# Patient Record
Sex: Female | Born: 1970 | Race: Asian | Hispanic: No | Marital: Married | Smoking: Never smoker
Health system: Southern US, Community
[De-identification: ages and names within clinical notes are randomized; demographics above are authoritative.]

## PROBLEM LIST (undated history)

## (undated) DIAGNOSIS — R7303 Prediabetes: Secondary | ICD-10-CM

## (undated) DIAGNOSIS — R51 Headache: Secondary | ICD-10-CM

## (undated) DIAGNOSIS — R519 Headache, unspecified: Secondary | ICD-10-CM

## (undated) DIAGNOSIS — Z9109 Other allergy status, other than to drugs and biological substances: Secondary | ICD-10-CM

## (undated) HISTORY — PX: ESOPHAGOGASTRODUODENOSCOPY: SHX1529

---

## 2012-03-13 ENCOUNTER — Ambulatory Visit: Payer: Self-pay | Admitting: Internal Medicine

## 2013-04-18 ENCOUNTER — Ambulatory Visit: Payer: Self-pay | Admitting: Internal Medicine

## 2013-11-07 IMAGING — US US EXTREM LOW VENOUS*R*
1 series · 14 of 24 positions shown · non-contrast
Comparison: none

REASON FOR EXAM: Right calf pain and swelling
COMMENTS:

PROCEDURE:     US  - US DOPPLER LOW EXTR RIGHT  - April 18, 2013  [DATE]
RESULT:     Standard right Lower Extremity color flow dopplerobtained. No
deep venous thrombosis. Color flow analysis and Doppler analysis as well as
compression studies are normal.

[Series 1: us extrem low venous*right* · 0.10mm/px · 14 of 25 slices shown]
[im 1/25]
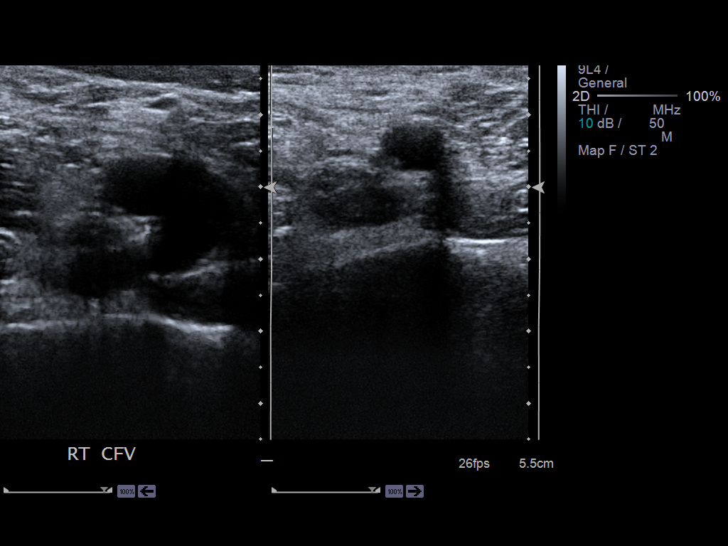
[im 3/25]
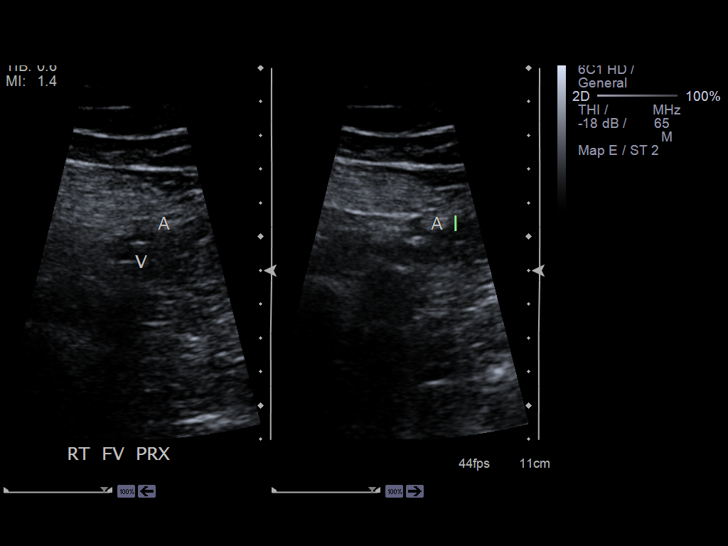
[im 5/25]
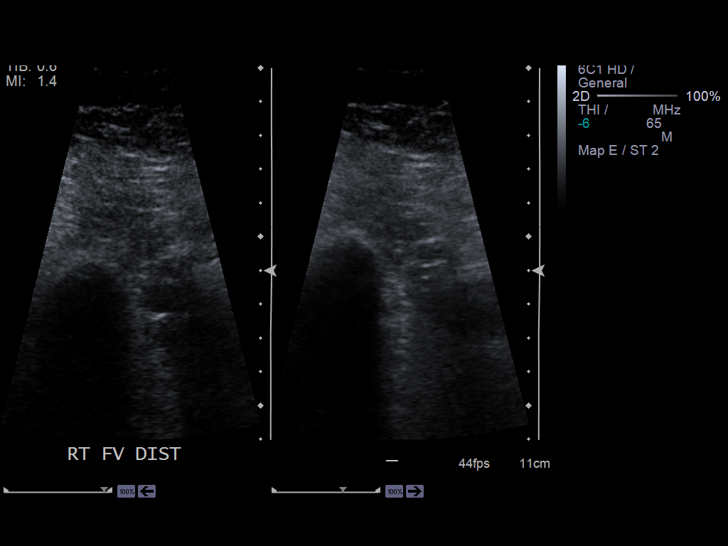
[im 7/25]
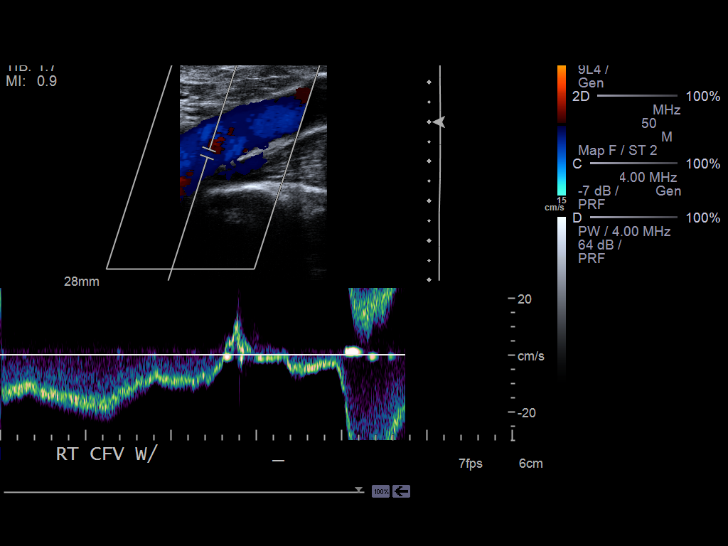
[im 8/25]
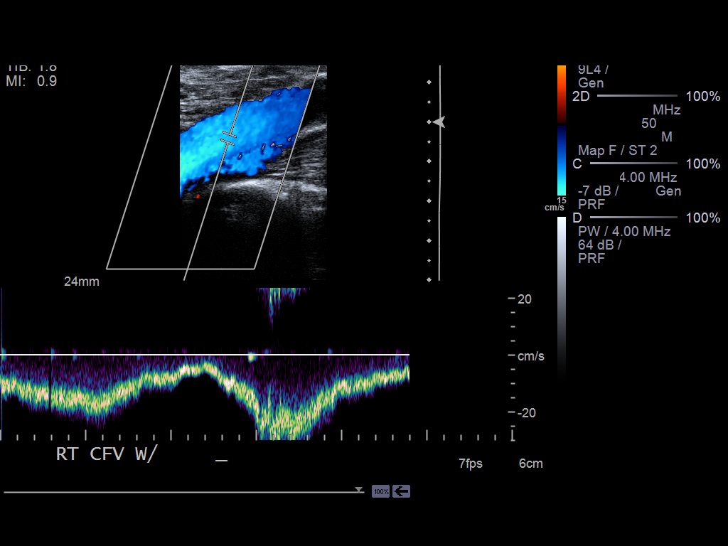
[im 10/25]
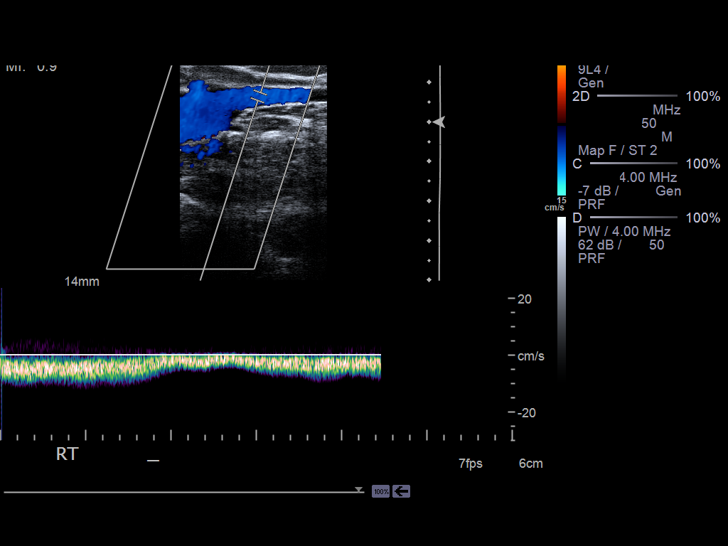
[im 12/25]
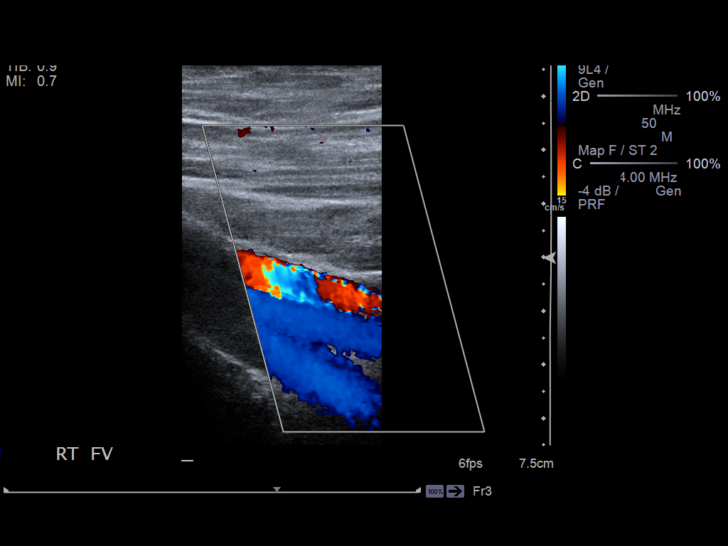
[im 13/25]
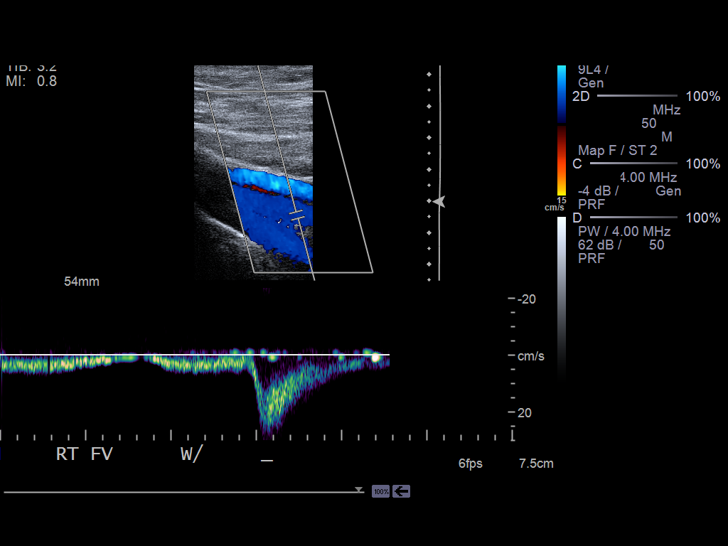
[im 15/25]
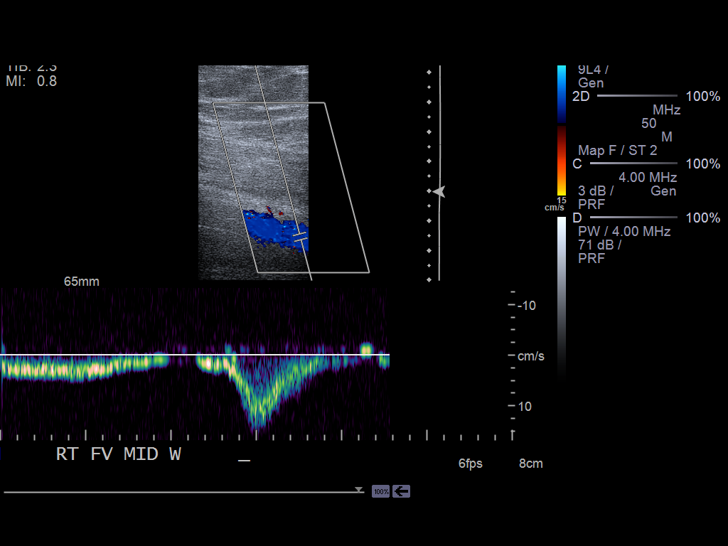
[im 17/25]
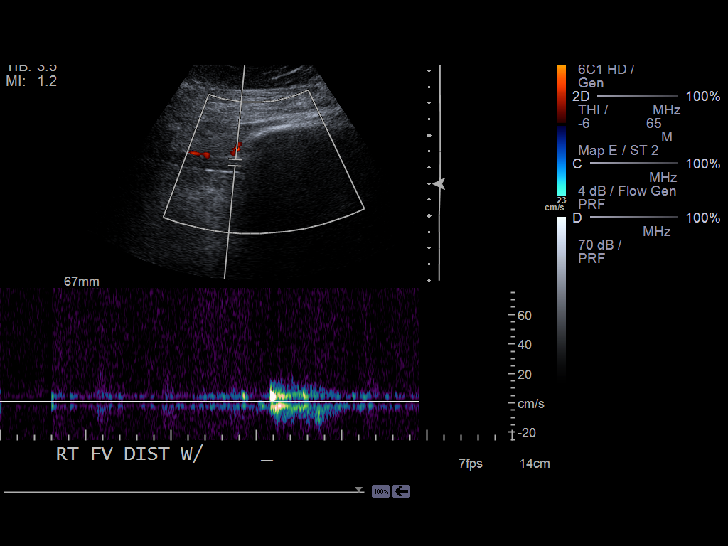
[im 19/25]
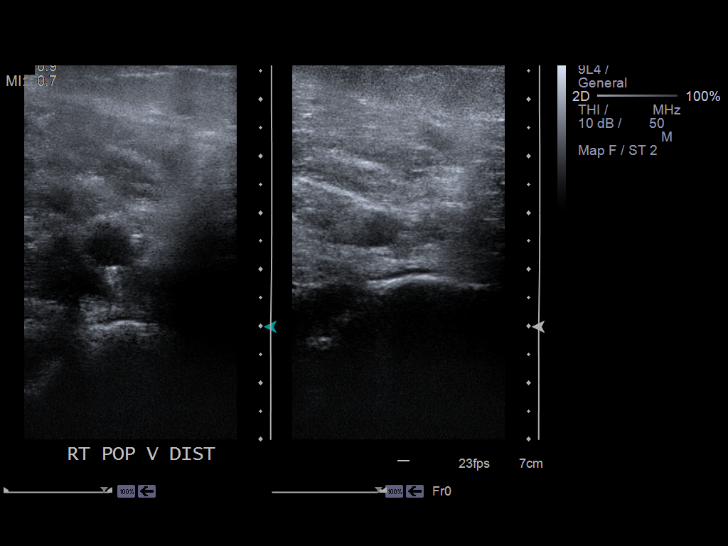
[im 20/25]
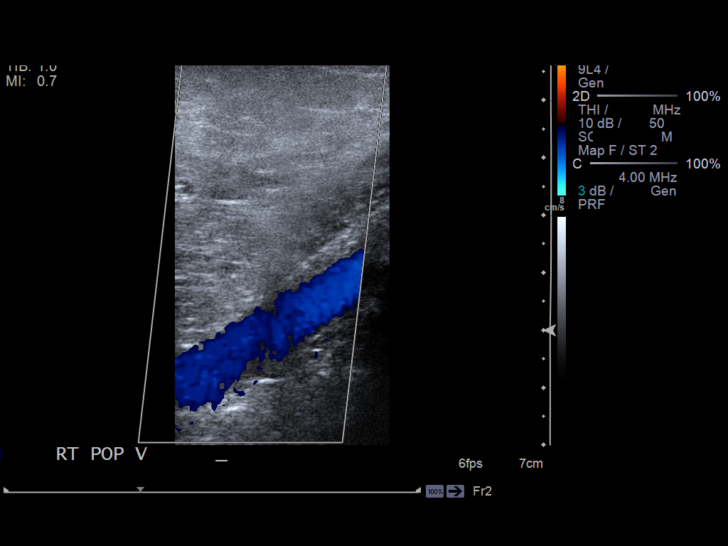
[im 22/25]
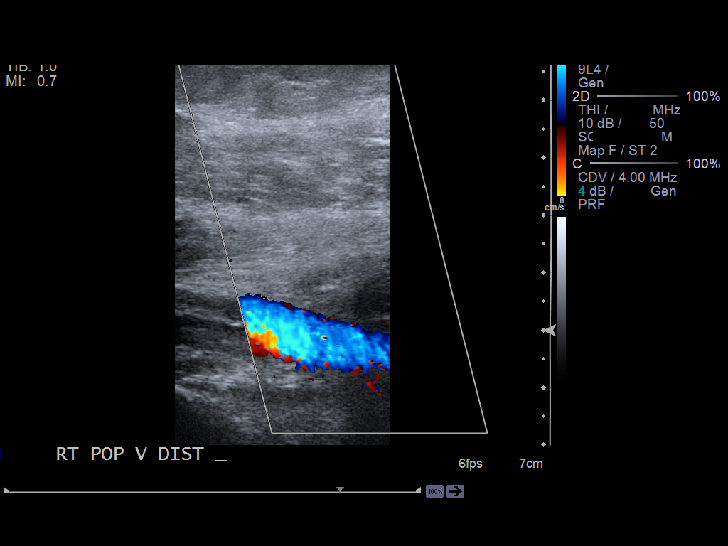
[im 25/25]
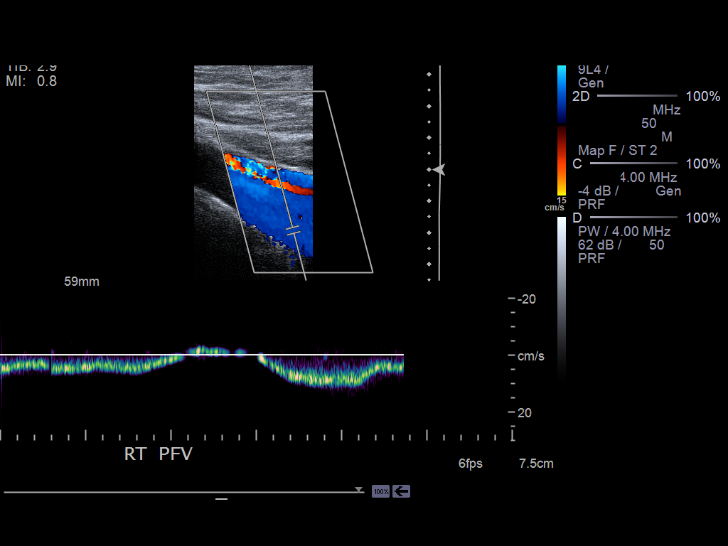

[14 of 24 positions shown; findings below may reference images not displayed]

IMPRESSION: Normal exam.

## 2014-05-12 ENCOUNTER — Ambulatory Visit: Payer: Self-pay

## 2015-12-21 ENCOUNTER — Encounter: Payer: Self-pay | Admitting: Emergency Medicine

## 2015-12-21 ENCOUNTER — Ambulatory Visit
Admission: EM | Admit: 2015-12-21 | Discharge: 2015-12-21 | Disposition: A | Discharge status: Home / Self Care | Payer: BC Managed Care – PPO | Attending: Family Medicine | Admitting: Family Medicine

## 2015-12-21 DIAGNOSIS — M5431 Sciatica, right side: Secondary | ICD-10-CM

## 2015-12-21 HISTORY — DX: Other allergy status, other than to drugs and biological substances: Z91.09

## 2015-12-21 MED ORDER — KETOROLAC TROMETHAMINE 60 MG/2ML IM SOLN
60.0000 mg | Freq: Once | INTRAMUSCULAR | Status: AC
  Administered 2015-12-21: 60 mg via INTRAMUSCULAR

## 2015-12-21 MED ORDER — HYDROCODONE-ACETAMINOPHEN 5-325 MG PO TABS: ORAL_TABLET | ORAL | Status: DC

## 2015-12-21 MED ORDER — CYCLOBENZAPRINE HCL 10 MG PO TABS: 10.0000 mg | ORAL_TABLET | Freq: Every day | ORAL | Status: AC

## 2015-12-21 MED ORDER — PREDNISONE 20 MG PO TABS: ORAL_TABLET | ORAL | Status: DC

## 2015-12-21 NOTE — ED Notes (Signed)
Lower back pain for 2-3 weeks , getting worse. Recently moved site at work. Pain shooting down Right leg. Has had sciatica before.

## 2015-12-21 NOTE — ED Notes (Signed)
Has been taking ibuprofen 800 mg 3x day and heating pad and not helping.

## 2015-12-21 NOTE — ED Provider Notes (Signed)
CSN: [id number]     Arrival date & time 12/21/15  1754 History   First MD Initiated Contact with Patient 12/21/15 1956     Chief Complaint  Patient presents with  . Back Pain   (Consider location/radiation/quality/duration/timing/severity/associated sxs/prior Treatment) Patient is a 45 y.o. female presenting with back pain. The history is provided by the patient.  Back Pain Location:  Lumbar spine Quality:  Shooting Radiates to:  R posterior upper leg Pain severity:  Moderate Pain is:  Same all the time Onset quality:  Gradual Duration:  2 weeks Timing:  Constant Progression:  Unchanged Chronicity:  New Context: lifting heavy objects and twisting   Context: not emotional stress, not falling, not MVA, not occupational injury, not pedestrian accident and not recent injury   Relieved by:  Nothing Ineffective treatments:  NSAIDs Associated symptoms: no abdominal pain, no abdominal swelling, no bladder incontinence, no bowel incontinence, no chest pain, no dysuria, no fever, no headaches, no leg pain, no numbness, no paresthesias, no pelvic pain, no perianal numbness, no tingling, no weakness and no weight loss   Risk factors: no hx of cancer, no hx of osteoporosis, not pregnant, no recent surgery, no steroid use and no vascular disease     Past Medical History  Diagnosis Date  . Environmental allergies    History reviewed. No pertinent past surgical history. History reviewed. No pertinent family history. Social History  Substance Use Topics  . Smoking status: Never Smoker   . Smokeless tobacco: None  . Alcohol Use: Yes     Comment: occasionally   OB History    No data available     Review of Systems  Constitutional: Negative for fever and weight loss.  Cardiovascular: Negative for chest pain.  Gastrointestinal: Negative for abdominal pain and bowel incontinence.  Genitourinary: Negative for bladder incontinence, dysuria and pelvic pain.  Musculoskeletal: Positive for  back pain.  Neurological: Negative for tingling, weakness, numbness, headaches and paresthesias.    Allergies  Almond (diagnostic); Dust mite extract; Mold extract; Peanut-containing drug products; and Pollen extract  Home Medications   Prior to Admission medications   Medication Sig Start Date End Date Taking? Authorizing Provider  cetirizine (ZYRTEC) 10 MG tablet Take 10 mg by mouth daily.   Yes Historical Provider, MD  ibuprofen (ADVIL,MOTRIN) 600 MG tablet Take 400 mg by mouth every 6 (six) hours as needed.   Yes Historical Provider, MD  Clemmie Krill POWD by Does not apply route.   Yes Historical Provider, MD  Multiple Vitamins-Minerals (MULTIVITAMIN ADULT PO) Take by mouth.   Yes Historical Provider, MD  Vitamin D, Ergocalciferol, (DRISDOL) 50000 units CAPS capsule Take 5,000 Units by mouth every 7 (seven) days.   Yes Historical Provider, MD  cyclobenzaprine (FLEXERIL) 10 MG tablet Take 1 tablet (10 mg total) by mouth at bedtime. 12/21/15   Payton Mccallum, MD  HYDROcodone-acetaminophen (NORCO/VICODIN) 5-325 MG tablet 1-2 tabs po q 8 hours prn 12/21/15   Payton Mccallum, MD  predniSONE (DELTASONE) 20 MG tablet 3 tabs po qd x 2 days, then 2 tabs po qd for 3 days, then 1 tab po qd for 3 days, then half a tab po qd for 2 days 12/21/15   Payton Mccallum, MD   Meds Ordered and Administered this Visit   Medications  ketorolac (TORADOL) injection 60 mg (60 mg Intramuscular Given 12/21/15 2009)    BP 117/71 mmHg  Pulse 77  Temp(Src) 98.1 F (36.7 C) (Oral)  Resp 18  Ht  (  1.651 m)  Wt 218 lb (98.884 kg)  BMI 36.28 kg/m2  SpO2 99% No data found.   Physical Exam  Constitutional: She appears well-developed and well-nourished. No distress.  Musculoskeletal: She exhibits tenderness. She exhibits no edema.       Lumbar back: She exhibits tenderness (over the right lumbar paraspinous muscles and right buttock) and spasm. She exhibits normal range of motion, no bony tenderness, no swelling, no  edema, no deformity, no laceration, no pain and normal pulse.       Back:  Neurological: She is alert. She has normal reflexes. She exhibits normal muscle tone.  Skin: Skin is warm and dry. No rash noted. She is not diaphoretic. No erythema.  Nursing note and vitals reviewed.   ED Course  Procedures (including critical care time)  Labs Review Labs Reviewed - No data to display  Imaging Review No results found.   Visual Acuity Review  Right Eye Distance:   Left Eye Distance:   Bilateral Distance:    Right Eye Near:   Left Eye Near:    Bilateral Near:         MDM   1. Sciatica of right side    New Prescriptions   CYCLOBENZAPRINE (FLEXERIL) 10 MG TABLET    Take 1 tablet (10 mg total) by mouth at bedtime.   HYDROCODONE-ACETAMINOPHEN (NORCO/VICODIN) 5-325 MG TABLET    1-2 tabs po q 8 hours prn   PREDNISONE (DELTASONE) 20 MG TABLET    3 tabs po qd x 2 days, then 2 tabs po qd for 3 days, then 1 tab po qd for 3 days, then half a tab po qd for 2 days   1.  diagnosis reviewed with patient 2. rx as per orders above; reviewed possible side effects, interactions, risks and benefits  3. Recommend supportive treatment with heat, gentle stretches 4. Follow-up prn if symptoms worsen or don't improve    Payton Mccallum, MD 12/21/15 2015

## 2017-03-24 ENCOUNTER — Other Ambulatory Visit: Payer: Self-pay

## 2017-03-24 DIAGNOSIS — Z9884 Bariatric surgery status: Secondary | ICD-10-CM

## 2017-03-28 ENCOUNTER — Telehealth: Payer: Self-pay | Admitting: Gastroenterology

## 2017-03-28 NOTE — Telephone Encounter (Signed)
03/28/17 [employer] website for EGD [id number] / [id number] Auth #: [id number].

## 2017-04-11 ENCOUNTER — Encounter: Payer: Self-pay | Admitting: *Deleted

## 2017-04-14 NOTE — Discharge Instructions (Signed)
General Anesthesia, Adult, Care After °These instructions provide you with information about caring for yourself after your procedure. Your health care provider may also give you more specific instructions. Your treatment has been planned according to current medical practices, but problems sometimes occur. Call your health care provider if you have any problems or questions after your procedure. °What can I expect after the procedure? °After the procedure, it is common to have: °· Vomiting. °· A sore throat. °· Mental slowness. ° °It is common to feel: °· Nauseous. °· Cold or shivery. °· Sleepy. °· Tired. °· Sore or achy, even in parts of your body where you did not have surgery. ° °Follow these instructions at home: °For at least 24 hours after the procedure: °· Do not: °? Participate in activities where you could fall or become injured. °? Drive. °? Use heavy machinery. °? Drink alcohol. °? Take sleeping pills or medicines that cause drowsiness. °? Make important decisions or sign legal documents. °? Take care of children on your own. °· Rest. °Eating and drinking °· If you vomit, drink water, juice, or soup when you can drink without vomiting. °· Drink enough fluid to keep your urine clear or pale yellow. °· Make sure you have little or no nausea before eating solid foods. °· Follow the diet recommended by your health care provider. °General instructions °· Have a responsible adult stay with you until you are awake and alert. °· Return to your normal activities as told by your health care provider. Ask your health care provider what activities are safe for you. °· Take over-the-counter and prescription medicines only as told by your health care provider. °· If you smoke, do not smoke without supervision. °· Keep all follow-up visits as told by your health care provider. This is important. °Contact a health care provider if: °· You continue to have nausea or vomiting at home, and medicines are not helpful. °· You  cannot drink fluids or start eating again. °· You cannot urinate after 8-12 hours. °· You develop a skin rash. °· You have fever. °· You have increasing redness at the site of your procedure. °Get help right away if: °· You have difficulty breathing. °· You have chest pain. °· You have unexpected bleeding. °· You feel that you are having a life-threatening or urgent problem. °This information is not intended to replace advice given to you by your health care provider. Make sure you discuss any questions you have with your health care provider. °Document Released: 01/23/2001 Document Revised: 03/21/2016 Document Reviewed: 10/01/2015 °Elsevier Interactive Patient Education © 2018 [employer] ° °

## 2017-04-17 ENCOUNTER — Encounter
Admission: RE | Disposition: A | Discharge status: Home / Self Care | Payer: Self-pay | Source: Ambulatory Visit | Attending: Gastroenterology

## 2017-04-17 ENCOUNTER — Ambulatory Visit
Admission: RE | Admit: 2017-04-17 | Discharge: 2017-04-17 | Disposition: A | Discharge status: Home / Self Care | Payer: 59 | Source: Ambulatory Visit | Attending: Gastroenterology | Admitting: Gastroenterology

## 2017-04-17 ENCOUNTER — Ambulatory Visit: Payer: 59 | Admitting: Anesthesiology

## 2017-04-17 DIAGNOSIS — Z01818 Encounter for other preprocedural examination: Secondary | ICD-10-CM | POA: Diagnosis not present

## 2017-04-17 DIAGNOSIS — Z91030 Bee allergy status: Secondary | ICD-10-CM | POA: Diagnosis not present

## 2017-04-17 DIAGNOSIS — R7303 Prediabetes: Secondary | ICD-10-CM | POA: Diagnosis not present

## 2017-04-17 DIAGNOSIS — Z7984 Long term (current) use of oral hypoglycemic drugs: Secondary | ICD-10-CM | POA: Insufficient documentation

## 2017-04-17 DIAGNOSIS — K253 Acute gastric ulcer without hemorrhage or perforation: Secondary | ICD-10-CM

## 2017-04-17 DIAGNOSIS — Z79899 Other long term (current) drug therapy: Secondary | ICD-10-CM | POA: Diagnosis not present

## 2017-04-17 DIAGNOSIS — K2970 Gastritis, unspecified, without bleeding: Secondary | ICD-10-CM | POA: Diagnosis not present

## 2017-04-17 DIAGNOSIS — K259 Gastric ulcer, unspecified as acute or chronic, without hemorrhage or perforation: Secondary | ICD-10-CM | POA: Insufficient documentation

## 2017-04-17 DIAGNOSIS — Z91048 Other nonmedicinal substance allergy status: Secondary | ICD-10-CM | POA: Diagnosis not present

## 2017-04-17 DIAGNOSIS — Z6841 Body Mass Index (BMI) 40.0 and over, adult: Secondary | ICD-10-CM | POA: Insufficient documentation

## 2017-04-17 DIAGNOSIS — Z91010 Allergy to peanuts: Secondary | ICD-10-CM | POA: Insufficient documentation

## 2017-04-17 DIAGNOSIS — Z91038 Other insect allergy status: Secondary | ICD-10-CM | POA: Diagnosis not present

## 2017-04-17 DIAGNOSIS — Z91018 Allergy to other foods: Secondary | ICD-10-CM | POA: Diagnosis not present

## 2017-04-17 DIAGNOSIS — K2950 Unspecified chronic gastritis without bleeding: Secondary | ICD-10-CM | POA: Diagnosis not present

## 2017-04-17 HISTORY — PX: ESOPHAGOGASTRODUODENOSCOPY (EGD) WITH PROPOFOL: SHX5813

## 2017-04-17 HISTORY — DX: Headache: R51

## 2017-04-17 HISTORY — DX: Prediabetes: R73.03

## 2017-04-17 HISTORY — DX: Headache, unspecified: R51.9

## 2017-04-17 LAB — GLUCOSE, CAPILLARY: GLUCOSE-CAPILLARY: 89 mg/dL (ref 65–99)

## 2017-04-17 SURGERY — ESOPHAGOGASTRODUODENOSCOPY (EGD) WITH PROPOFOL
Anesthesia: General | Wound class: Clean Contaminated

## 2017-04-17 MED ORDER — OXYCODONE HCL 5 MG/5ML PO SOLN: 5.0000 mg | Freq: Once | ORAL | Status: DC | PRN

## 2017-04-17 MED ORDER — PROPOFOL 10 MG/ML IV BOLUS
INTRAVENOUS | Status: DC | PRN
  Administered 2017-04-17: 100 mg via INTRAVENOUS
  Administered 2017-04-17: 50 mg via INTRAVENOUS

## 2017-04-17 MED ORDER — LIDOCAINE HCL (CARDIAC) 20 MG/ML IV SOLN
INTRAVENOUS | Status: DC | PRN
  Administered 2017-04-17: 50 mg via INTRAVENOUS

## 2017-04-17 MED ORDER — OXYCODONE HCL 5 MG PO TABS: 5.0000 mg | ORAL_TABLET | Freq: Once | ORAL | Status: DC | PRN

## 2017-04-17 MED ORDER — LACTATED RINGERS IV SOLN
INTRAVENOUS | Status: DC
  Administered 2017-04-17: 10:00:00 via INTRAVENOUS

## 2017-04-17 MED ORDER — GLYCOPYRROLATE 0.2 MG/ML IJ SOLN
INTRAMUSCULAR | Status: DC | PRN
  Administered 2017-04-17: 0.2 mg via INTRAVENOUS

## 2017-04-17 SURGICAL SUPPLY — 32 items

## 2017-04-17 NOTE — Anesthesia Postprocedure Evaluation (Signed)
Anesthesia Post Note  Patient: Teresa Schmidt  Procedure(s) Performed: Procedure(s) (LRB): ESOPHAGOGASTRODUODENOSCOPY (EGD) WITH PROPOFOL (N/A)  Patient location during evaluation: PACU Anesthesia Type: General Level of consciousness: awake and alert Pain management: pain level controlled Vital Signs Assessment: post-procedure vital signs reviewed and stable Respiratory status: spontaneous breathing Cardiovascular status: blood pressure returned to baseline Postop Assessment: no headache Anesthetic complications: no    Verner Cholunkle, III,  Freya Zobrist D

## 2017-04-17 NOTE — Anesthesia Preprocedure Evaluation (Signed)
Anesthesia Evaluation  Patient identified by MRN, date of birth, ID band Patient awake    Reviewed: Allergy & Precautions, H&P , NPO status , Patient's Chart, lab work & pertinent test results  Airway Mallampati: II  TM Distance: >3 FB Neck ROM: full    Dental no notable dental hx.    Pulmonary neg pulmonary ROS,    Pulmonary exam normal        Cardiovascular negative cardio ROS Normal cardiovascular exam     Neuro/Psych    GI/Hepatic negative GI ROS, Neg liver ROS,   Endo/Other  negative endocrine ROS  Renal/GU negative Renal ROS     Musculoskeletal   Abdominal   Peds  Hematology negative hematology ROS (+)   Anesthesia Other Findings   Reproductive/Obstetrics negative OB ROS                             Anesthesia Physical Anesthesia Plan  ASA: II  Anesthesia Plan: General   Post-op Pain Management:    Induction:   PONV Risk Score and Plan:   Airway Management Planned:   Additional Equipment:   Intra-op Plan:   Post-operative Plan:   Informed Consent: I have reviewed the patients History and Physical, chart, labs and discussed the procedure including the risks, benefits and alternatives for the proposed anesthesia with the patient or authorized representative who has indicated his/her understanding and acceptance.     Plan Discussed with:   Anesthesia Plan Comments:         Anesthesia Quick Evaluation  

## 2017-04-17 NOTE — Op Note (Signed)
[facility] Gastroenterology Patient Name: Teresa Schmidt Procedure Date: 04/17/2017 10:34 AM MRN: [id number] Account #: [id number] Date of Birth: 1970-12-08 Admit Type: Outpatient Age: 46 [facility] Gender: Female Note Status: Finalized Procedure:            Upper GI endoscopy Indications:          Preoperative assessment for bariatric surgery to treat                        morbid obesity Providers:            Midge Miniumarren Robertine Kipper MD, MD Referring MD:         Tyrone AppleMichael A. Tyner (Referring MD) Medicines:            Propofol per Anesthesia Complications:        No immediate complications. Procedure:            Pre-Anesthesia Assessment:                       - Prior to the procedure, a History and Physical was                        performed, and patient medications and allergies were                        reviewed. The patient's tolerance of previous                        anesthesia was also reviewed. The risks and benefits of                        the procedure and the sedation options and risks were                        discussed with the patient. All questions were                        answered, and informed consent was obtained. Prior                        Anticoagulants: The patient has taken no previous                        anticoagulant or antiplatelet agents. ASA Grade                        Assessment: II - A patient with mild systemic disease.                        After reviewing the risks and benefits, the patient was                        deemed in satisfactory condition to undergo the                        procedure.                       After obtaining informed consent, the endoscope was  passed under direct vision. Throughout the procedure,                        the patient's blood pressure, pulse, and oxygen                        saturations were monitored continuously. The Olympus   190 Endoscope [id number]) was introduced through the                        mouth, and advanced to the second part of duodenum. The                        upper GI endoscopy was accomplished without difficulty.                        The patient tolerated the procedure well. Findings:      The examined esophagus was normal.      Localized moderate inflammation characterized by erosions and erythema       was found in the gastric antrum. Biopsies were taken with a cold forceps       for histology. Biopsies were taken with a cold forceps for histology.      One non-bleeding cratered gastric ulcer with no stigmata of bleeding was       found in the gastric antrum.      The examined duodenum was normal.      There was no bile seen in the stomach. Impression:           - Normal esophagus.                       - Gastritis. Biopsied.                       - Non-bleeding gastric ulcer with no stigmata of                        bleeding.                       - Normal examined duodenum.                       - There was no bile seen in the stomach. Recommendation:       - Await pathology results.                       - Resume previous diet.                       - Continue present medications. Procedure Code(s):    --- Professional ---                       [phone], Esophagogastroduodenoscopy, flexible, transoral;                        with biopsy, single or multiple Diagnosis Code(s):    --- Professional ---                       Z66.063, Encounter for other preprocedural examination  K25.9, Gastric ulcer, unspecified as acute or chronic,                        without hemorrhage or perforation                       K29.70, Gastritis, unspecified, without bleeding CPT copyright 2016 American Medical Association. All rights reserved. The codes documented in this report are preliminary and upon coder review may  be revised to meet current compliance requirements. Midge Minium MD, MD 04/17/2017 10:51:43 AM This report has been signed electronically. Number of Addenda: 0 Note Initiated On: 04/17/2017 10:34 AM Total Procedure Duration: 0 hours 2 minutes 21 seconds       [facility]

## 2017-04-17 NOTE — Anesthesia Procedure Notes (Signed)
Performed by: Shruthi Northrup Pre-anesthesia Checklist: Patient identified, Emergency Drugs available, Suction available, Timeout performed and Patient being monitored Patient Re-evaluated:Patient Re-evaluated prior to induction Oxygen Delivery Method: Nasal cannula Placement Confirmation: positive ETCO2       

## 2017-04-17 NOTE — Transfer of Care (Signed)
Immediate Anesthesia Transfer of Care Note  Patient: Teresa Schmidt  Procedure(s) Performed: Procedure(s): ESOPHAGOGASTRODUODENOSCOPY (EGD) WITH PROPOFOL (N/A)  Patient Location: PACU  Anesthesia Type: General  Level of Consciousness: awake, alert  and patient cooperative  Airway and Oxygen Therapy: Patient Spontanous Breathing and Patient connected to supplemental oxygen  Post-op Assessment: Post-op Vital signs reviewed, Patient's Cardiovascular Status Stable, Respiratory Function Stable, Patent Airway and No signs of Nausea or vomiting  Post-op Vital Signs: Reviewed and stable  Complications: No apparent anesthesia complications

## 2017-04-17 NOTE — H&P (Signed)
   Midge Miniumarren Marilin Kofman, MD [facility] [address] [place], [place] [address] Phone:[phone] Fax : [phone]  Primary Care Physician:  System, Provider Not In Primary Gastroenterologist:  Dr. Servando SnareWohl  Pre-Procedure History & Physical: HPI:  Teresa Schmidt is a 46 y.o. female is here for an endoscopy.   Past Medical History:  Diagnosis Date  . Environmental allergies   . Headache    stress/allergy  3x/wk  . Pre-diabetes     Past Surgical History:  Procedure Laterality Date  . ESOPHAGOGASTRODUODENOSCOPY      Prior to Admission medications   Medication Sig Start Date End Date Taking? Authorizing Provider  Biotin 5000 MCG TABS Take by mouth daily.   Yes [provider]  cetirizine (ZYRTEC) 10 MG tablet Take 10 mg by mouth daily.   Yes [provider]  Cholecalciferol (VITAMIN D) 2000 units CAPS Take by mouth daily.   Yes [provider]  FERROUS SULFATE PO Take by mouth daily.   Yes [provider]  ibuprofen (ADVIL,MOTRIN) 600 MG tablet Take 400 mg by mouth every 6 (six) hours as needed.   Yes [provider]  metFORMIN (GLUCOPHAGE) 500 MG tablet Take 1,000 mg by mouth daily with breakfast.   Yes [provider]  cyclobenzaprine (FLEXERIL) 10 MG tablet Take 1 tablet (10 mg total) by mouth at bedtime. Patient not taking: Reported on 04/11/2017 12/21/15   [name], [place], MD    Allergies as of 03/24/2017 - Review Complete 12/21/2015  Allergen Reaction Noted  . Almond (diagnostic)  12/21/2015  . Dust mite extract  12/21/2015  . Mold extract [trichophyton]  12/21/2015  . Peanut-containing drug products  12/21/2015  . Pollen extract  12/21/2015    History reviewed. No pertinent family history.  Social History   Social History  . Marital status: Married    Spouse name: N/A  . Number of children: N/A  . Years of education: N/A   Occupational History  . Not on file.   Social History Main Topics  . Smoking status:  Never Smoker  . Smokeless tobacco: Never Used  . Alcohol use No     Comment: occasionally  . Drug use: Unknown  . Sexual activity: Not on file   Other Topics Concern  . Not on file   Social History Narrative  . No narrative on file    Review of Systems: See HPI, otherwise negative ROS  Physical Exam: BP 115/61   Pulse 80   Temp 97.6 F (36.4 C)   Ht 5\' 5"  (1.651 m)   Wt 249 lb (112.9 kg)   SpO2 98%   BMI 41.44 kg/m  General:   Alert,  pleasant and cooperative in NAD Head:  Normocephalic and atraumatic. Neck:  Supple; no masses or thyromegaly. Lungs:  Clear throughout to auscultation.    Heart:  Regular rate and rhythm. Abdomen:  Soft, nontender and nondistended. Normal bowel sounds, without guarding, and without rebound.   Neurologic:  Alert and  oriented x4;  grossly normal neurologically.  Impression/Plan: Teresa Schmidt is here for an endoscopy to be performed for pre op gastric bypass  Risks, benefits, limitations, and alternatives regarding  endoscopy have been reviewed with the patient.  Questions have been answered.  All parties agreeable.   Midge Miniumarren Santhiago Collingsworth, MD  04/17/2017, 10:08 AM

## 2017-04-18 ENCOUNTER — Encounter: Payer: Self-pay | Admitting: Gastroenterology

## 2017-04-21 ENCOUNTER — Encounter: Payer: Self-pay | Admitting: Gastroenterology

## 2017-05-02 ENCOUNTER — Encounter: Payer: Self-pay | Admitting: Gastroenterology

## 2018-10-12 ENCOUNTER — Telehealth: Payer: Self-pay

## 2018-10-12 NOTE — Telephone Encounter (Signed)
Pt calling to see when her Mirena was inserted.  [phone]  Pt aware it was placed 10/14/2013.  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

## 2020-07-17 ENCOUNTER — Encounter: Payer: Self-pay | Admitting: Emergency Medicine

## 2020-07-17 ENCOUNTER — Other Ambulatory Visit: Payer: Self-pay

## 2020-07-17 ENCOUNTER — Ambulatory Visit
Admission: EM | Admit: 2020-07-17 | Discharge: 2020-07-17 | Disposition: A | Discharge status: Home / Self Care | Payer: 59 | Attending: Internal Medicine | Admitting: Internal Medicine

## 2020-07-17 DIAGNOSIS — Z23 Encounter for immunization: Secondary | ICD-10-CM

## 2020-07-17 DIAGNOSIS — S61211A Laceration without foreign body of left index finger without damage to nail, initial encounter: Secondary | ICD-10-CM | POA: Diagnosis not present

## 2020-07-17 MED ORDER — TETANUS-DIPHTH-ACELL PERTUSSIS 5-2.5-18.5 LF-MCG/0.5 IM SUSP
0.5000 mL | Freq: Once | INTRAMUSCULAR | Status: AC
  Administered 2020-07-17: 0.5 mL via INTRAMUSCULAR

## 2020-07-17 NOTE — ED Provider Notes (Signed)
[facility]    CSN: [id number] Arrival date & time: 07/17/20  1830      History   Chief Complaint Chief Complaint  Patient presents with  . Laceration    left finger    HPI Teresa Schmidt is a 49 y.o. female who presents with L index finger laceration which happed from a hedge trimmer yesterday. She cleaned area with peroxide and has been applying neosporin. Her last TDAP was 2007. Has mild pain.     Past Medical History:  Diagnosis Date  . Environmental allergies   . Headache    stress/allergy  3x/wk  . Pre-diabetes     Patient Active Problem List   Diagnosis Date Noted  . Preop examination   . Acute gastric ulcer without hemorrhage or perforation but with obstruction   . Gastritis without bleeding     Past Surgical History:  Procedure Laterality Date  . ESOPHAGOGASTRODUODENOSCOPY    . ESOPHAGOGASTRODUODENOSCOPY (EGD) WITH PROPOFOL N/A 04/17/2017   Procedure: ESOPHAGOGASTRODUODENOSCOPY (EGD) WITH PROPOFOL;  Surgeon: Midge Minium, MD;  Location: [facility];  Service: Endoscopy;  Laterality: N/A;    OB History   No obstetric history on file.      Home Medications    Prior to Admission medications   Medication Sig Start Date End Date Taking? Authorizing Provider  aspirin-acetaminophen-caffeine (EXCEDRIN MIGRAINE) [phone] MG tablet Take by mouth daily as needed.   Yes [provider]  Biotin 5000 MCG TABS Take by mouth daily.   Yes [provider]  Butalbital-APAP-Caffeine (FIORICET) 50-300-40 MG CAPS Fioricet  1-2 tabs prn headaches   Yes [provider]  cetirizine (ZYRTEC) 10 MG tablet Take 10 mg by mouth daily.   Yes [provider]  Cholecalciferol (VITAMIN D) 2000 units CAPS Take by mouth daily.   Yes [provider]  cyclobenzaprine (FLEXERIL) 10 MG tablet Take 1 tablet (10 mg total) by mouth at bedtime. 12/21/15  Yes Payton Mccallum, MD  levonorgestrel (MIRENA) 20 MCG/24HR IUD by  Intrauterine route.   Yes [provider]  Multiple Vitamin (MULTIVITAMIN PO) Take by mouth.   Yes [provider]  FERROUS SULFATE PO Take by mouth daily.    [provider]  ibuprofen (ADVIL,MOTRIN) 600 MG tablet Take 400 mg by mouth every 6 (six) hours as needed.    [provider]  metFORMIN (GLUCOPHAGE) 500 MG tablet Take 1,000 mg by mouth daily with breakfast.    [provider]    Family History Family History  Problem Relation Age of Onset  . Hyperlipidemia Mother   . Hyperlipidemia Father   . Prostate cancer Father     Social History Social History   Tobacco Use  . Smoking status: Never Smoker  . Smokeless tobacco: Never Used  Vaping Use  . Vaping Use: Never used  Substance Use Topics  . Alcohol use: Yes    Comment: rare  . Drug use: Never     Allergies   Latex, Pollen extract, Cashew nut oil, Liraglutide -weight management, Almond (diagnostic), Cat hair extract, Dust mite extract, Peanut-containing drug products, and Trichophyton   Review of Systems Review of Systems  Musculoskeletal: Negative for arthralgias and joint swelling.  Skin: Positive for wound.  Neurological: Negative for numbness.    Physical Exam Triage Vital Signs ED Triage Vitals  Enc Vitals Group     BP 07/17/20 1849 106/71     Pulse Rate 07/17/20 1849 64     Resp 07/17/20 1849  18     Temp 07/17/20 1849 98.2 F (36.8 C)     Temp Source 07/17/20 1849 Oral     SpO2 07/17/20 1849 100 %     Weight 07/17/20 1849 170 lb (77.1 kg)     Height 07/17/20 1849 5\' 5"  (1.651 m)     Head Circumference --      Peak Flow --      Pain Score 07/17/20 1848 4     Pain Loc --      Pain Edu? --      Excl. in GC? --    No data found.  Updated Vital Signs BP 106/71 (BP Location: Left Arm)   Pulse 64   Temp 98.2 F (36.8 C) (Oral)   Resp 18   Ht 5\' 5"  (1.651 m)   Wt 170 lb (77.1 kg)   SpO2 100%   BMI 28.29 kg/m   Visual Acuity Right Eye Distance:    Left Eye Distance:   Bilateral Distance:    Right Eye Near:   Left Eye Near:    Bilateral Near:     Physical Exam Vitals and nursing note reviewed.  Constitutional:      General: She is not in acute distress.    Appearance: She is not toxic-appearing.  HENT:     Right Ear: External ear normal.     Left Ear: External ear normal.  Eyes:     General: No scleral icterus.    Conjunctiva/sclera: Conjunctivae normal.  Pulmonary:     Effort: Pulmonary effort is normal.  Musculoskeletal:        General: Signs of injury present. No deformity.     Cervical back: Neck supple.     Comments: L distal index fingers with 2 superficial lacerations which are clean and the volar one is about 1 cm and the dorsal is about 1.5 cm in length. The would is clean and there is no signs of infection. The skin is already coming together well.   Skin:    General: Skin is warm and dry.     Findings: No bruising or erythema.  Neurological:     Mental Status: She is alert and oriented to person, place, and time.     Gait: Gait normal.  Psychiatric:        Mood and Affect: Mood normal.        Behavior: Behavior normal.        Thought Content: Thought content normal.        Judgment: Judgment normal.    UC Treatments / Results  Labs (all labs ordered are listed, but only abnormal results are displayed) Labs Reviewed - No data to display  EKG   Radiology No results found.  Procedures Procedures (including critical care time)  Medications Ordered in UC Medications  Tdap (BOOSTRIX) injection 0.5 mL (0.5 mLs Intramuscular Given 07/17/20 1912)    Initial Impression / Assessment and Plan / UC Course  I have reviewed the triage vital signs and the nursing notes. TDAP was given.  superficial L index laceration.  I applied benzoin and 1/4 inch stery strips across them to keep them together and placed her on a splint. Wound care instructions reviewed with pt.   Final Clinical Impressions(s) / UC  Diagnoses   Final diagnoses:  None   Discharge Instructions   None    ED Prescriptions    None     PDMP not reviewed this encounter.   Rodriguez-Southworth, , 07/19/20  07/17/20 1929  

## 2020-07-17 NOTE — ED Triage Notes (Signed)
Patient in today after cutting her left index finger with hedge trimmers yesterday. Patient cleaned the area with peroxide and has been applying neosporin. Patient's last Tdap was 2007.
